# Patient Record
Sex: Male | Born: 2005 | Race: White | Hispanic: No | Marital: Single
Health system: Southern US, Community
[De-identification: ages and names within clinical notes are randomized; demographics above are authoritative.]

---
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
## 2024-02-21 DIAGNOSIS — F418 Other specified anxiety disorders: Secondary | ICD-10-CM | POA: Diagnosis not present

## 2024-02-21 DIAGNOSIS — Z1331 Encounter for screening for depression: Secondary | ICD-10-CM | POA: Diagnosis not present

## 2024-02-21 DIAGNOSIS — Z1339 Encounter for screening examination for other mental health and behavioral disorders: Secondary | ICD-10-CM | POA: Diagnosis not present

## 2024-02-21 DIAGNOSIS — Z0000 Encounter for general adult medical examination without abnormal findings: Secondary | ICD-10-CM | POA: Diagnosis not present

## 2024-04-25 DIAGNOSIS — R0982 Postnasal drip: Secondary | ICD-10-CM | POA: Diagnosis not present

## 2024-04-25 DIAGNOSIS — R0981 Nasal congestion: Secondary | ICD-10-CM | POA: Diagnosis not present

## 2024-04-25 DIAGNOSIS — J019 Acute sinusitis, unspecified: Secondary | ICD-10-CM | POA: Diagnosis not present

## 2024-06-28 DIAGNOSIS — Z23 Encounter for immunization: Secondary | ICD-10-CM | POA: Diagnosis not present

## 2024-06-28 DIAGNOSIS — S91332A Puncture wound without foreign body, left foot, initial encounter: Secondary | ICD-10-CM | POA: Diagnosis not present

## 2024-06-28 DIAGNOSIS — M79672 Pain in left foot: Secondary | ICD-10-CM | POA: Diagnosis not present
